# Patient Record
Sex: Male | Born: 1995 | Race: White | Hispanic: No | Marital: Single | Smoking: Former smoker
Health system: Southern US, Community
[De-identification: ages and names within clinical notes are randomized; demographics above are authoritative.]

## PROBLEM LIST (undated history)

## (undated) DIAGNOSIS — J45909 Unspecified asthma, uncomplicated: Secondary | ICD-10-CM

## (undated) HISTORY — PX: OTHER SURGICAL HISTORY: SHX169

---

## 2020-04-03 ENCOUNTER — Encounter: Payer: Self-pay | Admitting: Emergency Medicine

## 2020-04-03 ENCOUNTER — Telehealth: Payer: Self-pay

## 2020-04-03 ENCOUNTER — Other Ambulatory Visit: Payer: Self-pay

## 2020-04-03 ENCOUNTER — Ambulatory Visit
Admission: EM | Admit: 2020-04-03 | Discharge: 2020-04-03 | Disposition: A | Discharge status: Home / Self Care | Payer: BC Managed Care – PPO | Attending: Family Medicine | Admitting: Family Medicine

## 2020-04-03 DIAGNOSIS — R369 Urethral discharge, unspecified: Secondary | ICD-10-CM | POA: Insufficient documentation

## 2020-04-03 HISTORY — DX: Unspecified asthma, uncomplicated: J45.909

## 2020-04-03 MED ORDER — CEFTRIAXONE SODIUM 500 MG IJ SOLR: 500.0000 mg | Freq: Once | INTRAMUSCULAR | Status: DC

## 2020-04-03 MED ORDER — DOXYCYCLINE HYCLATE 100 MG PO CAPS: 100.0000 mg | ORAL_CAPSULE | Freq: Two times a day (BID) | ORAL | 0 refills | Status: DC

## 2020-04-03 NOTE — Discharge Instructions (Signed)
We will call with positive results.  Safe sex.  Take care  Dr. Valary Manahan    

## 2020-04-03 NOTE — ED Triage Notes (Signed)
Patient here for STD screening for chlamydia and gonorrhea. Patient states he has had abdominal pain that started last Wednesday and went away this past Monday. Patient also c/o white penile discharge that started last night.

## 2020-04-04 LAB — CHLAMYDIA/NGC RT PCR (ARMC ONLY)
Chlamydia Tr: NOT DETECTED
N gonorrhoeae: NOT DETECTED

## 2020-04-04 NOTE — ED Provider Notes (Signed)
[facility]    CSN: [id number] Arrival date & time: 04/03/20  1758      History   Chief Complaint Chief Complaint  Patient presents with  . Exposure to STD   HPI  24 year old male presents with penile discharge.  Patient reports that last week he had abdominal pain and was seen at [facility].  He had a negative work-up.  Patient reports that his abdominal pain resolved as of this week.  Last night, patient noted white penile discharge.  He is having unprotected intercourse.  He is concerned that he may have an STD.  Desires testing today.  No other associated symptoms.  No other complaints.  Past Medical History:  Diagnosis Date  . Asthma    Home Medications    Prior to Admission medications   Medication Sig Start Date End Date Taking? Authorizing Provider  doxycycline (VIBRAMYCIN) 100 MG capsule Take 1 capsule (100 mg total) by mouth 2 (two) times daily. 04/03/20   Coral Spikes, DO   Social History Social History   Tobacco Use  . Smoking status: Former Research scientist (life sciences)  . Smokeless tobacco: Former Network engineer  . Vaping Use: Some days  Substance Use Topics  . Alcohol use: Yes  . Drug use: Never     Allergies   Penicillins   Review of Systems Review of Systems  Constitutional: Negative.   Genitourinary: Positive for discharge.   Physical Exam Triage Vital Signs ED Triage Vitals  Enc Vitals Group     BP 04/03/20 1842 (!) 155/94     Pulse Rate 04/03/20 1842 (!) 106     Resp 04/03/20 1842 18     Temp 04/03/20 1842 99 F (37.2 C)     Temp Source 04/03/20 1842 Oral     SpO2 04/03/20 1842 100 %     Weight 04/03/20 1840 240 lb (108.9 kg)     Height 04/03/20 1840 5\' 10"  (1.778 m)     Head Circumference --      Peak Flow --      Pain Score 04/03/20 1840 0     Pain Loc --      Pain Edu? --      Excl. in [place]? --    Updated Vital Signs BP (!) 155/94 (BP Location: Right Arm)   Pulse (!) 106   Temp 99 F (37.2 C) (Oral)   Resp 18   Ht 5\' 10"  (1.778 m)    Wt 108.9 kg   SpO2 100%   BMI 34.44 kg/m   Visual Acuity Right Eye Distance:   Left Eye Distance:   Bilateral Distance:    Right Eye Near:   Left Eye Near:    Bilateral Near:     Physical Exam Constitutional:      General: He is not in acute distress.    Appearance: Normal appearance. He is not ill-appearing.  HENT:     Head: Normocephalic and atraumatic.  Eyes:     General:        Right eye: No discharge.        Left eye: No discharge.     Conjunctiva/sclera: Conjunctivae normal.  Pulmonary:     Effort: Pulmonary effort is normal. No respiratory distress.  Genitourinary:    Penis: Normal.      Testes: Normal.  Neurological:     Mental Status: He is alert.  Psychiatric:        Mood and Affect: Mood normal.  Behavior: Behavior normal.    UC Treatments / Results  Labs (all labs ordered are listed, but only abnormal results are displayed) Labs Reviewed  CHLAMYDIA/NGC RT PCR [facility] ONLY)    EKG   Radiology No results found.  Procedures Procedures (including critical care time)  Medications Ordered in UC Medications - No data to display  Initial Impression / Assessment and Plan / UC Course  I have reviewed the triage vital signs and the nursing notes.  Pertinent labs & imaging results that were available during my care of the patient were reviewed by me and considered in my medical decision making (see chart for details).    24 year old male presents with penile discharge.  Was placed empirically on doxycycline after discussion with the patient.  Test results have returned negative (on 6/10).  Patient will be called and informed to stop the medication.  Advise safe sex.  Supportive care.  Final Clinical Impressions(s) / UC Diagnoses   Final diagnoses:  Penile discharge     Discharge Instructions     We will call with positive results.  Safe sex.  Take care  Dr. Adriana Simas    ED Prescriptions    Medication Sig Dispense Auth.  Provider   doxycycline (VIBRAMYCIN) 100 MG capsule Take 1 capsule (100 mg total) by mouth 2 (two) times daily. 14 capsule Everlene Other G, DO     PDMP not reviewed this encounter.   Everlene Other [place], [place] 04/04/20 [phone]

## 2021-05-28 ENCOUNTER — Other Ambulatory Visit: Payer: Self-pay

## 2021-05-28 ENCOUNTER — Emergency Department
Admission: EM | Admit: 2021-05-28 | Discharge: 2021-05-28 | Disposition: A | Discharge status: Home / Self Care | Payer: BC Managed Care – PPO | Attending: Emergency Medicine | Admitting: Emergency Medicine

## 2021-05-28 ENCOUNTER — Emergency Department: Payer: BC Managed Care – PPO

## 2021-05-28 DIAGNOSIS — S5012XA Contusion of left forearm, initial encounter: Secondary | ICD-10-CM | POA: Diagnosis not present

## 2021-05-28 DIAGNOSIS — Z87891 Personal history of nicotine dependence: Secondary | ICD-10-CM | POA: Insufficient documentation

## 2021-05-28 DIAGNOSIS — J45909 Unspecified asthma, uncomplicated: Secondary | ICD-10-CM | POA: Insufficient documentation

## 2021-05-28 DIAGNOSIS — S59912A Unspecified injury of left forearm, initial encounter: Secondary | ICD-10-CM | POA: Diagnosis present

## 2021-05-28 DIAGNOSIS — S0990XA Unspecified injury of head, initial encounter: Secondary | ICD-10-CM | POA: Diagnosis not present

## 2021-05-28 MED ORDER — KETOROLAC TROMETHAMINE 30 MG/ML IJ SOLN
30.0000 mg | Freq: Once | INTRAMUSCULAR | Status: DC
  Filled 2021-05-28: qty 1

## 2021-05-28 MED ORDER — MELOXICAM 15 MG PO TABS: 15.0000 mg | ORAL_TABLET | Freq: Every day | ORAL | 0 refills | Status: AC

## 2021-05-28 NOTE — Discharge Instructions (Addendum)
Take Meloxicam once daily for pain and inflammation.  
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
## 2022-04-06 IMAGING — CT CT HEAD W/O CM
4 series · 16 of 47 positions shown, 18 images · non-contrast
Comparison: None.

CLINICAL DATA: Head trauma, moderate to severe.

EXAM:
CT HEAD WITHOUT CONTRAST
TECHNIQUE: Contiguous axial images were obtained from the base of the skull
through the vertex without intravenous contrast.

[Series 2: head bone · axial · 0.43mm/px · z∈[-151,-123]mm · 3 of 72 slices shown]
[im 8/72  bone]
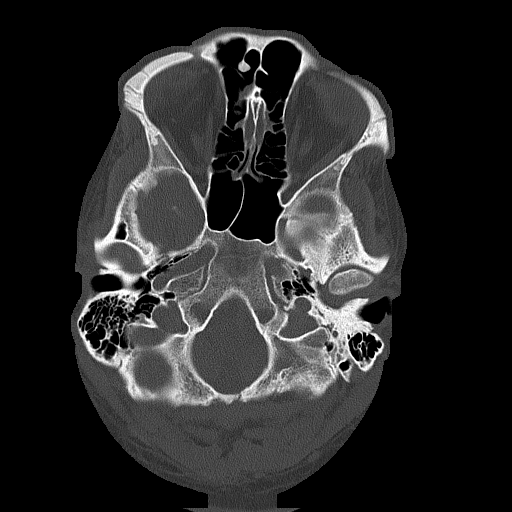
[im 15/72  bone]
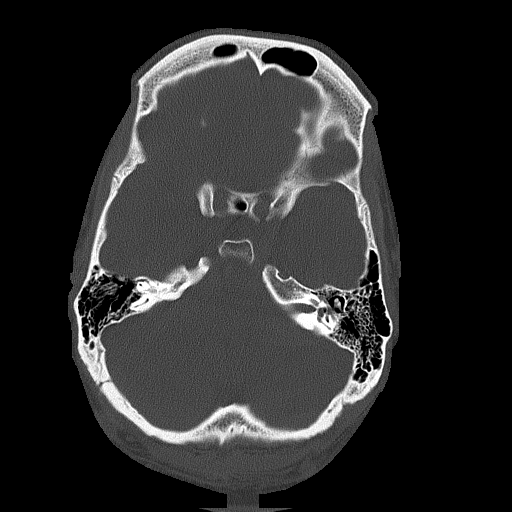
[im 22/72  bone]
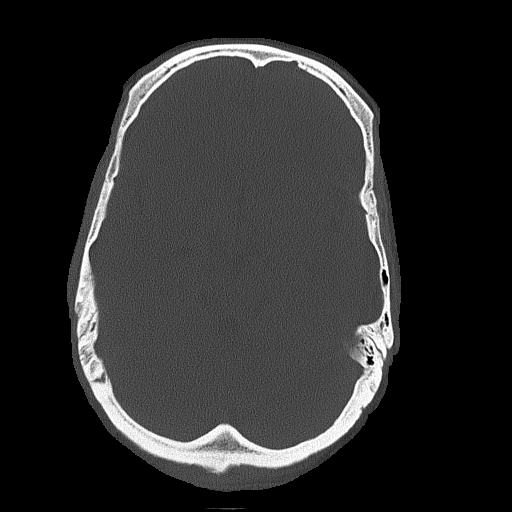

[Series 3: head wo · axial · 0.43mm/px · z∈[-150,-45]mm · 7 of 29 slices shown, 9 images]
[im 4/29  brain]
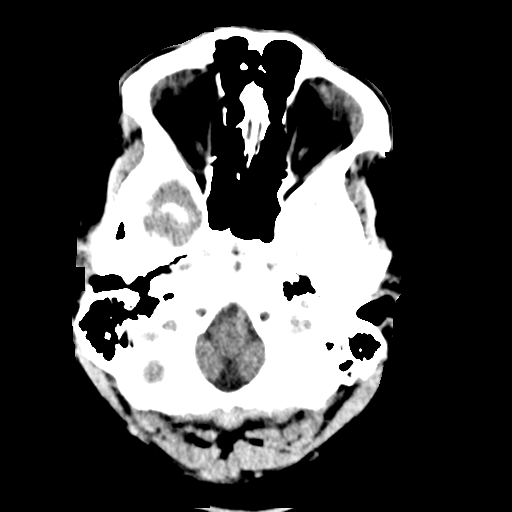
[im 4/29  bone]
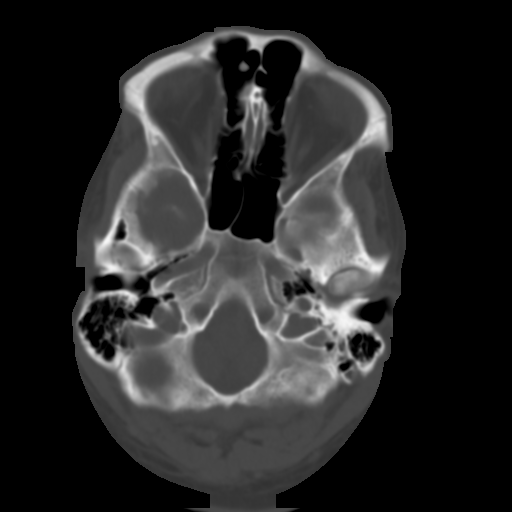
[im 8/29  brain]
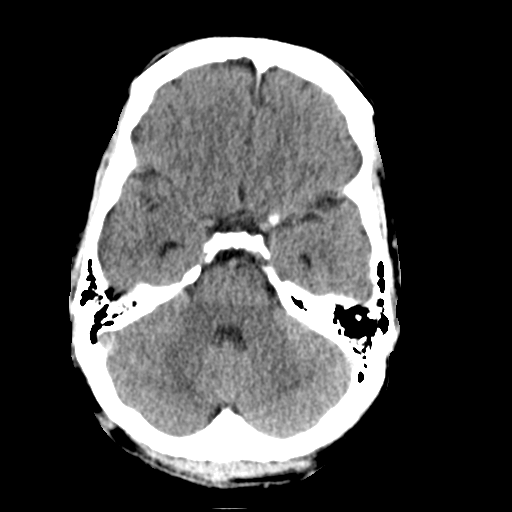
[im 11/29  brain]
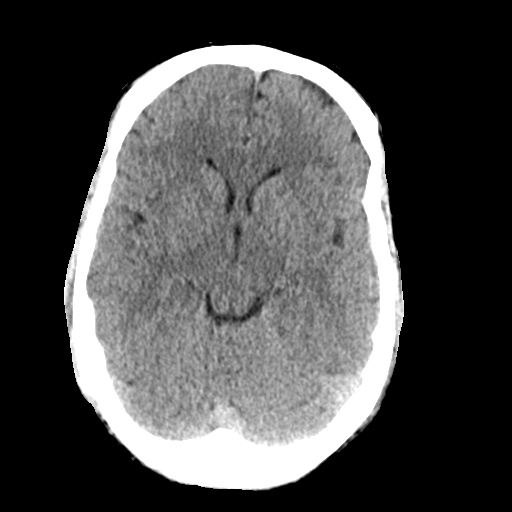
[im 15/29  brain]
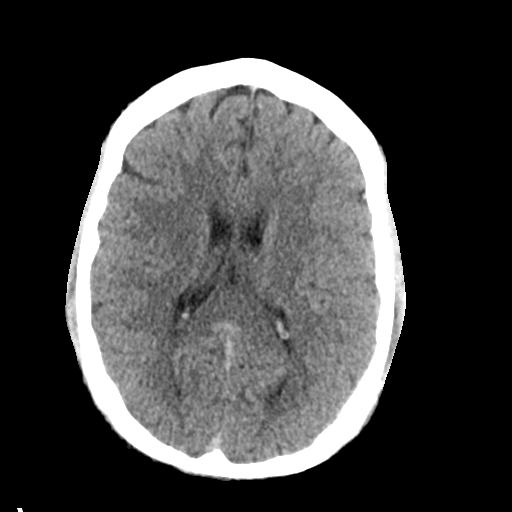
[im 18/29  brain]
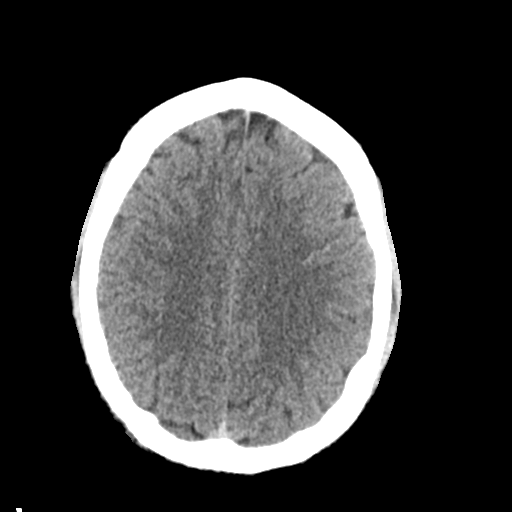
[im 18/29  bone]
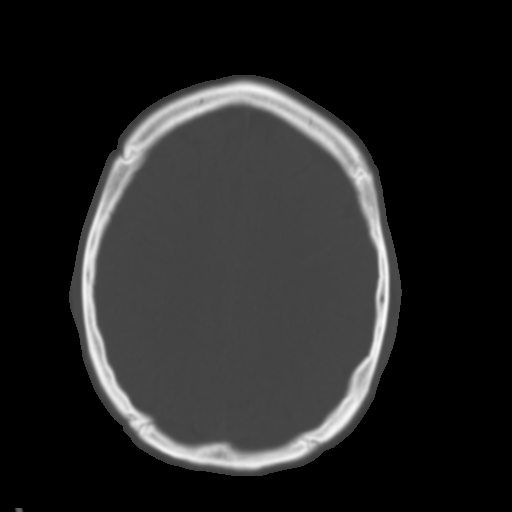
[im 22/29  brain]
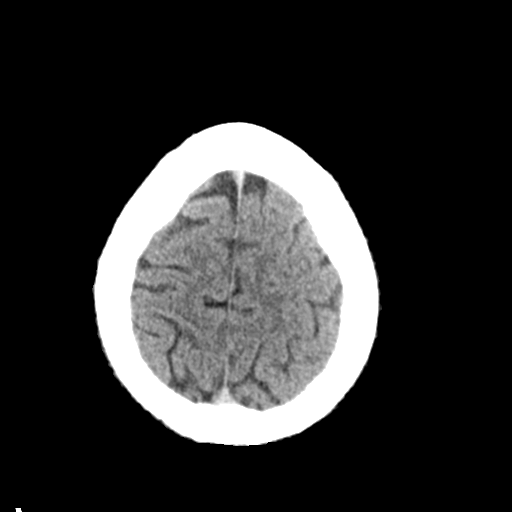
[im 25/29  brain]
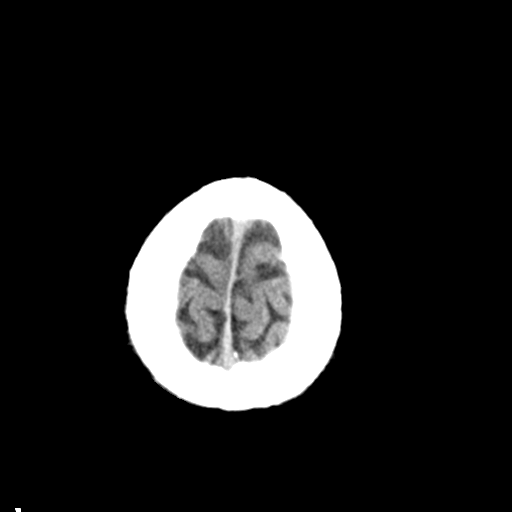

[Series 4: coronal soft tissue · coronal · 0.32mm/px · 3 of 63 slices shown]
[im 21/63  brain]
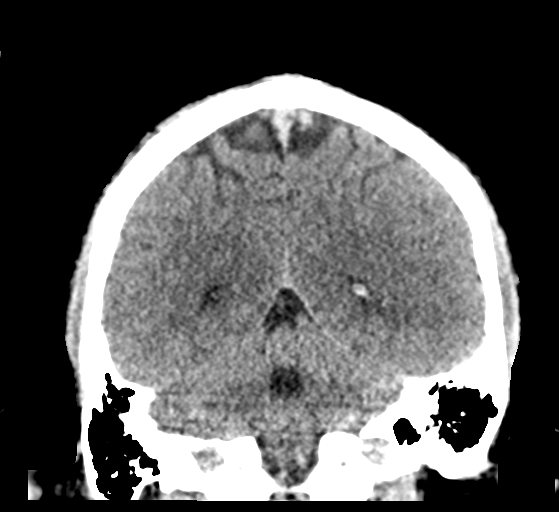
[im 28/63  brain]
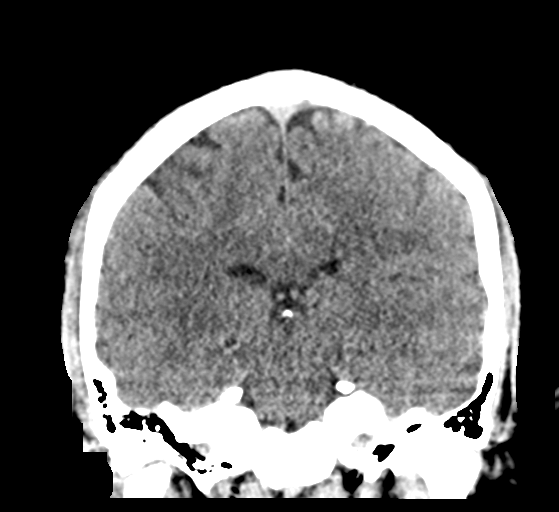
[im 35/63  brain]
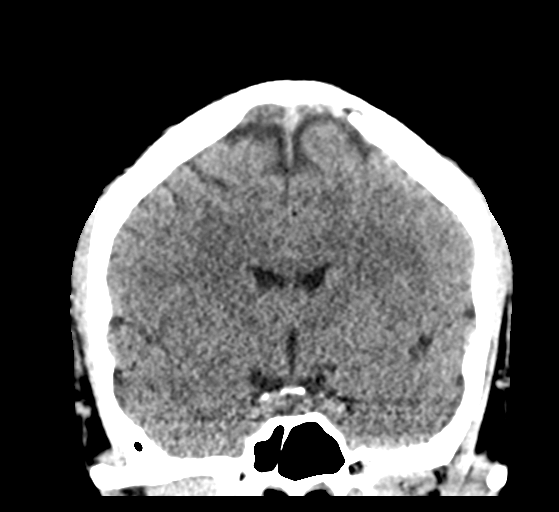

[Series 5: sagittal soft tissue · sagittal · 0.28mm/px · 3 of 54 slices shown]
[im 18/54  brain]
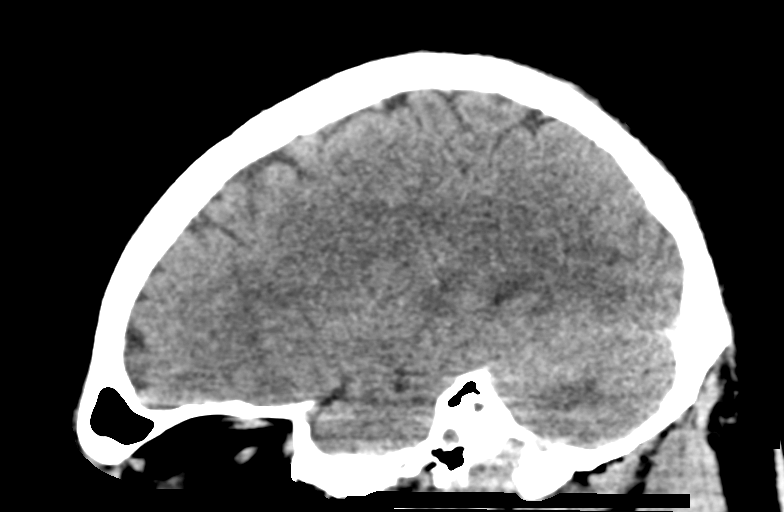
[im 27/54  brain]
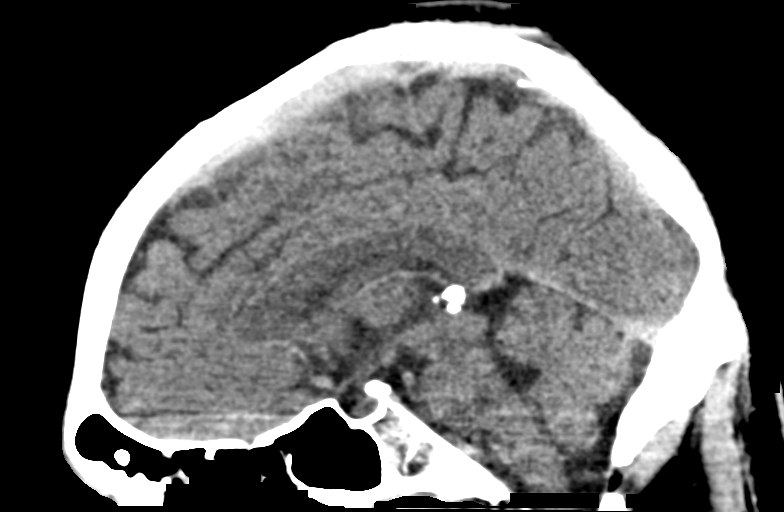
[im 36/54  brain]
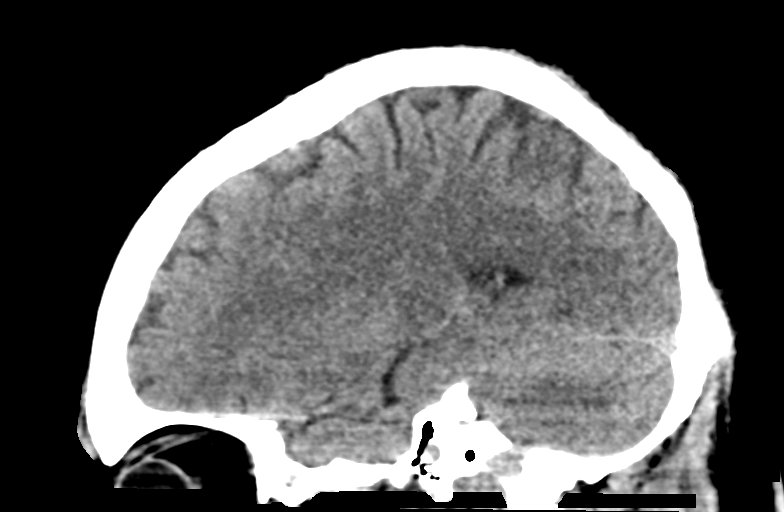

[16 of 47 positions shown; findings below may reference images not displayed]

FINDINGS: Brain: No evidence of acute infarction, hemorrhage, hydrocephalus,
extra-axial collection or mass lesion/mass effect.

Vascular: No hyperdense vessel or unexpected calcification.

Skull: Normal. Negative for fracture or focal lesion.

Sinuses/Orbits: Mild mucosal thickening in the ethmoid air cells.

Other: None
IMPRESSION: No acute intracranial abnormality.

## 2024-05-15 ENCOUNTER — Emergency Department: Payer: MEDICAID

## 2024-05-15 ENCOUNTER — Other Ambulatory Visit: Payer: Self-pay

## 2024-05-15 ENCOUNTER — Inpatient Hospital Stay
Admission: EM | Admit: 2024-05-15 | Discharge: 2024-05-18 | DRG: 872 | Disposition: A | Discharge status: Home / Self Care | Payer: MEDICAID | Attending: Internal Medicine | Admitting: Internal Medicine

## 2024-05-15 DIAGNOSIS — R590 Localized enlarged lymph nodes: Secondary | ICD-10-CM | POA: Diagnosis present

## 2024-05-15 DIAGNOSIS — L03211 Cellulitis of face: Secondary | ICD-10-CM | POA: Diagnosis present

## 2024-05-15 DIAGNOSIS — J45909 Unspecified asthma, uncomplicated: Secondary | ICD-10-CM | POA: Diagnosis present

## 2024-05-15 DIAGNOSIS — L03213 Periorbital cellulitis: Secondary | ICD-10-CM | POA: Diagnosis present

## 2024-05-15 DIAGNOSIS — Z825 Family history of asthma and other chronic lower respiratory diseases: Secondary | ICD-10-CM | POA: Diagnosis not present

## 2024-05-15 DIAGNOSIS — Z8249 Family history of ischemic heart disease and other diseases of the circulatory system: Secondary | ICD-10-CM

## 2024-05-15 DIAGNOSIS — K047 Periapical abscess without sinus: Secondary | ICD-10-CM | POA: Diagnosis present

## 2024-05-15 DIAGNOSIS — Z880 Allergy status to penicillin: Secondary | ICD-10-CM | POA: Diagnosis not present

## 2024-05-15 DIAGNOSIS — E663 Overweight: Secondary | ICD-10-CM | POA: Diagnosis present

## 2024-05-15 DIAGNOSIS — J4520 Mild intermittent asthma, uncomplicated: Secondary | ICD-10-CM

## 2024-05-15 DIAGNOSIS — Z6825 Body mass index (BMI) 25.0-25.9, adult: Secondary | ICD-10-CM

## 2024-05-15 DIAGNOSIS — Z8261 Family history of arthritis: Secondary | ICD-10-CM

## 2024-05-15 DIAGNOSIS — S025XXA Fracture of tooth (traumatic), initial encounter for closed fracture: Secondary | ICD-10-CM | POA: Diagnosis present

## 2024-05-15 DIAGNOSIS — Z87891 Personal history of nicotine dependence: Secondary | ICD-10-CM | POA: Diagnosis not present

## 2024-05-15 DIAGNOSIS — A419 Sepsis, unspecified organism: Principal | ICD-10-CM | POA: Diagnosis present

## 2024-05-15 LAB — CBC WITH DIFFERENTIAL/PLATELET
Abs Immature Granulocytes: 0.09 K/uL — ABNORMAL HIGH (ref 0.00–0.07)
Basophils Absolute: 0 K/uL (ref 0.0–0.1)
Basophils Relative: 0 %
Eosinophils Absolute: 0.1 K/uL (ref 0.0–0.5)
Eosinophils Relative: 1 %
HCT: 42.8 % (ref 39.0–52.0)
Hemoglobin: 14.3 g/dL (ref 13.0–17.0)
Immature Granulocytes: 1 %
Lymphocytes Relative: 11 %
Lymphs Abs: 1.7 K/uL (ref 0.7–4.0)
MCH: 29.5 pg (ref 26.0–34.0)
MCHC: 33.4 g/dL (ref 30.0–36.0)
MCV: 88.2 fL (ref 80.0–100.0)
Monocytes Absolute: 1.8 K/uL — ABNORMAL HIGH (ref 0.1–1.0)
Monocytes Relative: 11 %
Neutro Abs: 12.2 K/uL — ABNORMAL HIGH (ref 1.7–7.7)
Neutrophils Relative %: 76 %
Platelets: 268 K/uL (ref 150–400)
RBC: 4.85 MIL/uL (ref 4.22–5.81)
RDW: 13.2 % (ref 11.5–15.5)
WBC: 15.8 K/uL — ABNORMAL HIGH (ref 4.0–10.5)
nRBC: 0 % (ref 0.0–0.2)

## 2024-05-15 LAB — COMPREHENSIVE METABOLIC PANEL WITH GFR
ALT: 13 U/L (ref 0–44)
AST: 20 U/L (ref 15–41)
Albumin: 4.3 g/dL (ref 3.5–5.0)
Alkaline Phosphatase: 71 U/L (ref 38–126)
Anion gap: 10 (ref 5–15)
BUN: 12 mg/dL (ref 6–20)
CO2: 28 mmol/L (ref 22–32)
Calcium: 9.3 mg/dL (ref 8.9–10.3)
Chloride: 100 mmol/L (ref 98–111)
Creatinine, Ser: 0.72 mg/dL (ref 0.61–1.24)
GFR, Estimated: >60 mL/min (ref >60)
Glucose, Bld: 108 mg/dL — ABNORMAL HIGH (ref 70–99)
Potassium: 3.7 mmol/L (ref 3.5–5.1)
Sodium: 138 mmol/L (ref 135–145)
Total Bilirubin: 1 mg/dL (ref 0.0–1.2)
Total Protein: 7.6 g/dL (ref 6.5–8.1)

## 2024-05-15 LAB — LACTIC ACID, PLASMA
Lactic Acid, Venous: 1 mmol/L (ref 0.5–1.9)
Lactic Acid, Venous: 0.7 mmol/L (ref 0.5–1.9)

## 2024-05-15 LAB — SEDIMENTATION RATE: Sed Rate: 14 mm/h (ref 0–15)

## 2024-05-15 MED ORDER — SODIUM CHLORIDE 0.9 % IV BOLUS
1000.0000 mL | Freq: Once | INTRAVENOUS | Status: AC
  Administered 2024-05-15: 1000 mL via INTRAVENOUS

## 2024-05-15 MED ORDER — IBUPROFEN 800 MG PO TABS
800.0000 mg | ORAL_TABLET | Freq: Once | ORAL | Status: AC
  Administered 2024-05-15: 800 mg via ORAL
  Filled 2024-05-15: qty 1

## 2024-05-15 MED ORDER — IOHEXOL 300 MG/ML  SOLN
75.0000 mL | Freq: Once | INTRAMUSCULAR | Status: AC | PRN
Start: 2024-05-15 — End: 2024-05-15
  Administered 2024-05-15: 75 mL via INTRAVENOUS

## 2024-05-15 MED ORDER — LACTATED RINGERS IV SOLN: INTRAVENOUS | Status: AC

## 2024-05-15 MED ORDER — MORPHINE SULFATE (PF) 4 MG/ML IV SOLN
4.0000 mg | Freq: Once | INTRAVENOUS | Status: AC
  Administered 2024-05-15: 4 mg via INTRAVENOUS
  Filled 2024-05-15: qty 1

## 2024-05-15 MED ORDER — ALBUTEROL SULFATE HFA 108 (90 BASE) MCG/ACT IN AERS: 2.0000 | INHALATION_SPRAY | RESPIRATORY_TRACT | Status: DC | PRN

## 2024-05-15 MED ORDER — HEPARIN SODIUM (PORCINE) 5000 UNIT/ML IJ SOLN
5000.0000 [IU] | Freq: Three times a day (TID) | INTRAMUSCULAR | Status: DC
  Administered 2024-05-16 (×2): 5000 [IU] via SUBCUTANEOUS
  Filled 2024-05-15 (×2): qty 1

## 2024-05-15 MED ORDER — ALBUTEROL SULFATE (2.5 MG/3ML) 0.083% IN NEBU: 2.5000 mg | INHALATION_SOLUTION | RESPIRATORY_TRACT | Status: DC | PRN

## 2024-05-15 MED ORDER — LACTATED RINGERS IV SOLN: INTRAVENOUS | Status: DC

## 2024-05-15 MED ORDER — METRONIDAZOLE 500 MG/100ML IV SOLN
500.0000 mg | Freq: Once | INTRAVENOUS | Status: AC
  Administered 2024-05-15: 500 mg via INTRAVENOUS
  Filled 2024-05-15: qty 100

## 2024-05-15 MED ORDER — ONDANSETRON HCL 4 MG/2ML IJ SOLN: 4.0000 mg | Freq: Three times a day (TID) | INTRAMUSCULAR | Status: DC | PRN

## 2024-05-15 MED ORDER — ACETAMINOPHEN 325 MG PO TABS: 650.0000 mg | ORAL_TABLET | Freq: Four times a day (QID) | ORAL | Status: DC | PRN

## 2024-05-15 MED ORDER — DEXAMETHASONE SODIUM PHOSPHATE 10 MG/ML IJ SOLN
8.0000 mg | Freq: Two times a day (BID) | INTRAMUSCULAR | Status: DC
  Administered 2024-05-16 – 2024-05-17 (×3): 8 mg via INTRAVENOUS
  Filled 2024-05-15 (×3): qty 1

## 2024-05-15 MED ORDER — HYDROMORPHONE HCL 1 MG/ML IJ SOLN
1.0000 mg | Freq: Once | INTRAMUSCULAR | Status: AC
  Administered 2024-05-15: 1 mg via INTRAVENOUS
  Filled 2024-05-15: qty 1

## 2024-05-15 MED ORDER — LACTATED RINGERS IV BOLUS
500.0000 mL | Freq: Once | INTRAVENOUS | Status: AC
  Administered 2024-05-15: 500 mL via INTRAVENOUS

## 2024-05-15 MED ORDER — DEXAMETHASONE SODIUM PHOSPHATE 10 MG/ML IJ SOLN
10.0000 mg | Freq: Once | INTRAMUSCULAR | Status: AC
  Administered 2024-05-15: 10 mg via INTRAVENOUS
  Filled 2024-05-15: qty 1

## 2024-05-15 MED ORDER — ONDANSETRON HCL 4 MG/2ML IJ SOLN
4.0000 mg | Freq: Once | INTRAMUSCULAR | Status: AC
  Administered 2024-05-15: 4 mg via INTRAVENOUS
  Filled 2024-05-15: qty 2

## 2024-05-15 MED ORDER — SODIUM CHLORIDE 0.9 % IV SOLN
2.0000 g | Freq: Once | INTRAVENOUS | Status: AC
  Administered 2024-05-15: 2 g via INTRAVENOUS
  Filled 2024-05-15: qty 10

## 2024-05-15 MED ORDER — MORPHINE SULFATE (PF) 2 MG/ML IV SOLN
2.0000 mg | INTRAVENOUS | Status: DC | PRN
  Administered 2024-05-15 – 2024-05-16 (×2): 2 mg via INTRAVENOUS
  Filled 2024-05-15 (×2): qty 1

## 2024-05-15 MED ORDER — LACTATED RINGERS IV BOLUS (SEPSIS)
1000.0000 mL | Freq: Once | INTRAVENOUS | Status: AC
  Administered 2024-05-15: 1000 mL via INTRAVENOUS

## 2024-05-15 MED ORDER — OXYCODONE-ACETAMINOPHEN 5-325 MG PO TABS: 1.0000 | ORAL_TABLET | ORAL | Status: DC | PRN

## 2024-05-15 MED ORDER — OXYCODONE-ACETAMINOPHEN 5-325 MG PO TABS
1.0000 | ORAL_TABLET | ORAL | Status: AC | PRN
  Administered 2024-05-15 (×2): 1 via ORAL
  Filled 2024-05-15 (×2): qty 1

## 2024-05-15 MED ORDER — VANCOMYCIN HCL IN DEXTROSE 1-5 GM/200ML-% IV SOLN
1000.0000 mg | Freq: Once | INTRAVENOUS | Status: AC
  Administered 2024-05-15: 1000 mg via INTRAVENOUS
  Filled 2024-05-15: qty 200

## 2024-05-15 MED ORDER — CLINDAMYCIN PHOSPHATE 600 MG/50ML IV SOLN
600.0000 mg | Freq: Three times a day (TID) | INTRAVENOUS | Status: DC
  Administered 2024-05-16 (×2): 600 mg via INTRAVENOUS
  Filled 2024-05-15 (×3): qty 50

## 2024-05-15 MED ORDER — DM-GUAIFENESIN ER 30-600 MG PO TB12: 1.0000 | ORAL_TABLET | Freq: Two times a day (BID) | ORAL | Status: DC | PRN

## 2024-05-15 NOTE — Sepsis Progress Note (Signed)
 Elink following code sepsis

## 2024-05-15 NOTE — H&P (Signed)
 History and Physical    Luke Walter FMW:[id number] DOB: [birth year] DOA: 05/15/2024  Referring MD/NP/PA:   PCP: Patient, No Pcp Per   Patient coming from:  The patient is coming from home.     Chief Complaint: Dental pain, left sided facial swelling  HPI: Luke Walter is a 28 y.o. male with medical history significant of asthma and overweight, who presents with dental pain in the left side facial swelling.  Per patient and his mother at the bedside, he has left upper dental pain due to broken tooth on that side for more than 4 days, which has significantly worsened since last night.  He developed left facial swelling, fever and chills.  His temperature is 102.5 in ED.  His dental pain is severe, constant, aching, nonradiating.  Patient has trismus, with difficulty opening mouth.  No chest pain, cough, SOB.  No nausea, vomiting, diarrhea or abdominal.  No symptoms UTI.  Mother called the dentist, but the earliest available appointment will be on Thursday.  Data reviewed independently and ED Course: pt was found to have WBC 15.8, GFR> 60, lactic acid 1.0 --> 0.7.  Temperature while 2.5, blood pressure 127/83, heart rate 114, RR 20, oxygen saturation 97% on room air.  Patient is admitted to telemetry bed as inpatient.  ED physician consulted Dr. Rumalda of ENT.  CT-Neck soft  tissue: -1.7 cm abscess in the soft tissues along the left anterior aspect of the maxilla adjacent to a periapical abscess involving the left maxillary lateral incisor.   Surrounding soft tissue swelling within the facial soft tissues as above with extension into the preseptal soft tissues of the left orbit. Findings concerning for facial/preseptal cellulitis. There are no findings to suggest orbital cellulitis.   Enlarged cervical lymph nodes, likely reactive.    EKG: Not done in ED, will get one.    Review of Systems:   General: has fevers, chills, no body weight gain. HEENT: no blurry vision, hearing changes  or sore throat. Has dental pain and left facial swelling. Respiratory: no dyspnea, coughing, wheezing CV: no chest pain, no palpitations GI: no nausea, vomiting, abdominal pain, diarrhea, constipation GU: no dysuria, burning on urination, increased urinary frequency, hematuria  Ext: no leg edema Neuro: no unilateral weakness, numbness, or tingling, no vision change or hearing loss Skin: no rash, no skin tear. MSK: No muscle spasm, no deformity, no limitation of range of movement in spin Heme: No easy bruising.  Travel history: No recent long distant travel.   Allergy:  Allergies  Allergen Reactions   Penicillins Hives, Other (See Comments) and Swelling    Throat swelling and hives   Amoxicillin Hives and Swelling    Past Medical History:  Diagnosis Date   Asthma     Past Surgical History:  Procedure Laterality Date   dental procedure      Social History:  reports that he has quit smoking. He has quit using smokeless tobacco. He reports current alcohol use. He reports that he does not use drugs.  Family History:  Family History  Problem Relation Age of Onset   Heart failure Mother    COPD Mother    Rheum arthritis Mother      Prior to Admission medications   Medication Sig Start Date End Date Taking? Authorizing Provider  doxycycline  (VIBRAMYCIN ) 100 MG capsule Take 1 capsule (100 mg total) by mouth 2 (two) times daily. 04/03/20   Cook, Jayce G, DO    Physical Exam: Vitals:   05/15/24  1910 05/15/24 2000 05/15/24 2030 05/15/24 2100  BP:  122/75 127/85 127/83  Pulse: 90 99 96 (!) 114  Resp: 18   18  Temp: 99.5 F (37.5 C)     TempSrc: Oral   Oral  SpO2: 98% 98% 99% 97%  Weight:      Height:       General: Not in acute distress HEENT: has facial swelling, worse on the left, has chronic appearing dental fracture tooth #10 without obvious area of fluctuance. Has trismus.        Eyes: PERRL, EOMI, no jaundice       ENT: No discharge from the ears and nose        Neck: No JVD, no bruit, no mass felt. Heme: No neck lymph node enlargement. Cardiac: S1/S2, RRR, No murmurs, No gallops or rubs. Respiratory: No rales, wheezing, rhonchi or rubs. GI: Soft, nondistended, nontender, no rebound pain, no organomegaly, BS present. GU: No hematuria Ext: No pitting leg edema bilaterally. 1+DP/PT pulse bilaterally. Musculoskeletal: No joint deformities, No joint redness or warmth, no limitation of ROM in spin. Skin: No rashes.  Neuro: Alert, oriented X3, cranial nerves II-XII grossly intact, moves all extremities normally. Psych: Patient is not psychotic, no suicidal or hemocidal ideation.  Labs on Admission: I have personally reviewed following labs and imaging studies  CBC: Recent Labs  Lab 05/15/24 1555  WBC 15.8*  NEUTROABS 12.2*  HGB 14.3  HCT 42.8  MCV 88.2  PLT 268   Basic Metabolic Panel: Recent Labs  Lab 05/15/24 1555  NA 138  K 3.7  CL 100  CO2 28  GLUCOSE 108*  BUN 12  CREATININE 0.72  CALCIUM 9.3   GFR: Estimated Creatinine Clearance: 143.2 mL/min (by C-G formula based on SCr of 0.72 mg/dL). Liver Function Tests: Recent Labs  Lab 05/15/24 1555  AST 20  ALT 13  ALKPHOS 71  BILITOT 1.0  PROT 7.6  ALBUMIN 4.3   No results for input(s): LIPASE, AMYLASE in the last 168 hours. No results for input(s): AMMONIA in the last 168 hours. Coagulation Profile: No results for input(s): INR, PROTIME in the last 168 hours. Cardiac Enzymes: No results for input(s): CKTOTAL, CKMB, CKMBINDEX, TROPONINI in the last 168 hours. BNP (last 3 results) No results for input(s): PROBNP in the last 8760 hours. HbA1C: No results for input(s): HGBA1C in the last 72 hours. CBG: No results for input(s): GLUCAP in the last 168 hours. Lipid Profile: No results for input(s): CHOL, HDL, LDLCALC, TRIG, CHOLHDL, LDLDIRECT in the last 72 hours. Thyroid Function Tests: No results for input(s): TSH, T4TOTAL,  FREET4, T3FREE, THYROIDAB in the last 72 hours. Anemia Panel: No results for input(s): VITAMINB12, FOLATE, FERRITIN, TIBC, IRON, RETICCTPCT in the last 72 hours. Urine analysis: No results found for: COLORURINE, APPEARANCEUR, LABSPEC, PHURINE, GLUCOSEU, HGBUR, BILIRUBINUR, KETONESUR, PROTEINUR, UROBILINOGEN, NITRITE, LEUKOCYTESUR Sepsis Labs: @LABRCNTIP (procalcitonin:4,lacticidven:4) )No results found for this or any previous visit (from the past 240 hours).   Radiological Exams on Admission:   Assessment/Plan Principal Problem:   Periapical abscess Active Problems:   Sepsis (HCC)   Facial cellulitis   Preseptal cellulitis   Asthma   Overweight (BMI 25.0-29.9)   Assessment and Plan:  Sepsis due to periapical abscess,  preseptal cellulitis and  facial cellulitis: pt meets criteria for sepsis with WBC 15.8, temperature 102.5, heart rate 114.  Lactic acid normal 1.0 --> 0.7.  Currently hemodynamically stable.  EDP consulted Dr. Rumalda of ENT, who recommended to transfer patient to  where they have dental medicine or OMFS. Dr. Jossie of ED has made tremendous effort trying to transfer patient to the facility where there is dental medicine or OMFS.  Dr. Adine called [place], [facility] and [facility], unfortunately [facility] has no bed available.  I discussed with the patient, he and his mother understand that we do not have dental medicine or oral surgeon to deal with the offending tooth, they agree to stay here for antibiotic treatment.  Patient's mother will continue to contact his dentist, hoping to move his appointment from Thursday to earlier time.  - will admit to med-surg bed as inpatient - Empiric antimicrobial treatment with clindamycin  (patient received 1 dose of aztreonam , Flagyl  and vancomycin  in ED) - Decadron  8 mg twice daily - PRN Zofran  for nausea, and Tylenol , morphine  and Percocet for pain - Blood cultures x 2  - ESR and CRP - IVF: 1.5 L of  RL and 1L of NS bolus, followed by 75 cc/h    Asthma: stable - As needed albuterol  and Mucinex   Overweight (BMI 25.0-29.9): Body weight 81.6 kg, BMI 25.83 - Encourage losing weight - Exercise and healthy diet      DVT ppx: SQ Heparin     Code Status: Full code   Family Communication: mother at bedside  Disposition Plan:  Anticipate discharge back to previous environment  Consults called:  EDP consulted Dr. Rumalda of ENT  Admission status and Level of care: Telemetry Medical: as inpt        Dispo: The patient is from: Home              Anticipated d/c is to: Home              Anticipated d/c date is: 2 days              Patient currently is not medically stable to d/c.    Severity of Illness:  The appropriate patient status for this patient is INPATIENT. Inpatient status is judged to be reasonable and necessary in order to provide the required intensity of service to ensure the patient's safety. The patient's presenting symptoms, physical exam findings, and initial radiographic and laboratory data in the context of their chronic comorbidities is felt to place them at high risk for further clinical deterioration. Furthermore, it is not anticipated that the patient will be medically stable for discharge from the hospital within 2 midnights of admission.   * I certify that at the point of admission it is my clinical judgment that the patient will require inpatient hospital care spanning beyond 2 midnights from the point of admission due to high intensity of service, high risk for further deterioration and high frequency of surveillance required.*       Date of Service 05/16/2024    Caleb Exon [facility]   If 7PM-7AM, please contact night-coverage [email or web address] 05/16/2024, 1:34 AM

## 2024-05-15 NOTE — ED Triage Notes (Addendum)
 Pt presents with dental pain and swelling to the L side of his face that started 3 days ago. Pt states he has a broken tooth on that side. He also reports bad taste in his mouth and some difficulty swallowing. Denies fevers, chills. Pt has been taken ibuprofen  and APAP. Last dose was at 01:30 today. Pt noted to be without drooling and is swallowing secretions without difficulty; no difficulty breathing noted. Trismus present.

## 2024-05-15 NOTE — ED Provider Notes (Signed)
 [facility] Provider Note    Event Date/Time   First MD Initiated Contact with Patient 05/15/24 1614     (approximate)   History   Dental Pain   HPI  Luke Walter is a 28 y.o. male with history of asthma and as listed in EMR presents to the emergency department for treatment and evaluation of dental pain and swelling that initially only involve the left side of his face that started about 3 days ago.  Broken tooth but had not had any pain until 3 days ago.  He has had a bad taste in his mouth and swallowing feels different.  He took ibuprofen  and Tylenol  around 1:30 AM but otherwise has not had any medications.  He is able to control secretions and speech is clear.  He has significant pain when trying to open his mouth.  Swelling has now moved from the left side of the face across the nose and into the right side.   Physical Exam    Vitals:   05/15/24 1815 05/15/24 1817  BP:  122/68  Pulse: 86 86  Resp: 13 16  Temp:  (!) 101.1 F (38.4 C)  SpO2: 98% 98%    General: Awake, no distress.  CV:  Good peripheral perfusion.  Resp:  Normal effort.  Abd:  No distention.  Other:  Diffuse facial swelling, worse on the left.  Chronic appearing dental fracture tooth #10 without obvious area of fluctuance.   ED Results / Procedures / Treatments   Labs (all labs ordered are listed, but only abnormal results are displayed)  Labs Reviewed  COMPREHENSIVE METABOLIC PANEL WITH GFR - Abnormal; Notable for the following components:      Result Value   Glucose, Bld 108 (*)    All other components within normal limits  CBC WITH DIFFERENTIAL/PLATELET - Abnormal; Notable for the following components:   WBC 15.8 (*)    Neutro Abs 12.2 (*)    Monocytes Absolute 1.8 (*)    Abs Immature Granulocytes 0.09 (*)    All other components within normal limits  CULTURE, BLOOD (ROUTINE X 2)  CULTURE, BLOOD (ROUTINE X 2)  LACTIC ACID, PLASMA  LACTIC ACID, PLASMA      EKG  Not indicated.   RADIOLOGY  Image and radiology report reviewed and interpreted by me. Radiology report consistent with the same.  Pending.  PROCEDURES:  Critical Care performed: Yes, see critical care procedure note(s)  Procedures   MEDICATIONS ORDERED IN ED:  Medications  oxyCODONE -acetaminophen  (PERCOCET/ROXICET) 5-325 MG per tablet 1 tablet (1 tablet Oral Given 05/15/24 1153)  lactated ringers  infusion ( Intravenous New Bag/Given 05/15/24 1758)  sodium chloride  0.9 % bolus 1,000 mL (has no administration in time range)  lactated ringers  bolus 1,000 mL (0 mLs Intravenous Stopped 05/15/24 1751)  aztreonam  (AZACTAM ) 2 g in sodium chloride  0.9 % 100 mL IVPB (2 g Intravenous New Bag/Given 05/15/24 1802)  metroNIDAZOLE  (FLAGYL ) IVPB 500 mg (0 mg Intravenous Stopped 05/15/24 1751)  vancomycin  (VANCOCIN ) IVPB 1000 mg/200 mL premix (0 mg Intravenous Stopped 05/15/24 1817)  dexamethasone  (DECADRON ) injection 10 mg (10 mg Intravenous Given 05/15/24 1646)  ibuprofen  (ADVIL ) tablet 800 mg (800 mg Oral Given 05/15/24 1644)  iohexol  (OMNIPAQUE ) 300 MG/ML solution 75 mL (75 mLs Intravenous Contrast Given 05/15/24 1716)  morphine  (PF) 4 MG/ML injection 4 mg (4 mg Intravenous Given 05/15/24 1755)  ondansetron  (ZOFRAN ) injection 4 mg (4 mg Intravenous Given 05/15/24 1754)     IMPRESSION / MDM /  ASSESSMENT AND PLAN / ED COURSE   I have reviewed the triage note and vital signs. Vital signs concerning. Temperature of 102.5, heart rate of 91, blood pressure is stable.  Differential diagnosis includes, but is not limited to, facial cellulitis, dental abscess, sepsis  Patient's presentation is most consistent with acute presentation with potential threat to life or bodily function.  The patient is on the cardiac monitor to evaluate for evidence of arrhythmia and/or significant heart rate changes.  28 year old male presenting to the emergency department for treatment and evaluation of  facial swelling.  See HPI for further details.  While awaiting ER room assignment, labs were obtained.  He has a leukocytosis of 15.8 with left shift.  CMP is reassuring.  Patient does meet sepsis criteria.  IV fluids and broad-spectrum antibiotics ordered under sepsis protocol.  Clinical Course as of 05/15/24 1856  Mon May 15, 2024  1800 Initial lactic acid is 1.  CT has been performed and is pending. [CT]  1853 CT report is pending.  Care transferred to Dr. Jossie who will follow up on CT and admit. [CT]    Clinical Course User Index [CT] Darnesha Diloreto B, FNP   CRITICAL CARE Performed by: Kirk Gentry   Total critical care time: 45 minutes  Critical care time was exclusive of separately billable procedures and treating other patients.  Critical care was necessary to treat or prevent imminent or life-threatening deterioration.  Critical care was time spent personally by me on the following activities: development of treatment plan with patient and/or surrogate as well as nursing, discussions with consultants, evaluation of patient's response to treatment, examination of patient, obtaining history from patient or surrogate, ordering and performing treatments and interventions, ordering and review of laboratory studies, ordering and review of radiographic studies, pulse oximetry and re-evaluation of patient's condition.   FINAL CLINICAL IMPRESSION(S) / ED DIAGNOSES   Final diagnoses:  Facial cellulitis  Sepsis without acute organ dysfunction, due to unspecified organism [facility])     Rx / DC Orders   ED Discharge Orders     None        Note:  This document was prepared using Dragon voice recognition software and may include unintentional dictation errors.   Gentry Kirk NOVAK, FNP 05/15/24 1856    Bradler, Evan K, MD 05/15/24 [phone]

## 2024-05-15 NOTE — Consult Note (Signed)
 CODE SEPSIS - PHARMACY COMMUNICATION  **Broad Spectrum Antibiotics should be administered within 1 hour of Sepsis diagnosis**  Time Code Sepsis Called/Page Received: 1624  Antibiotics Ordered: Aztreonam , Vancomycin , Metronidazole    Time of 1st antibiotic administration: 1652  Additional action taken by pharmacy: N/A  If necessary, Name of Provider/Nurse Contacted: N/A  Evonnie Nieves ,PharmD Clinical Pharmacist  05/15/2024  4:34 PM

## 2024-05-16 ENCOUNTER — Encounter: Payer: Self-pay | Admitting: Internal Medicine

## 2024-05-16 ENCOUNTER — Other Ambulatory Visit: Payer: Self-pay

## 2024-05-16 DIAGNOSIS — K047 Periapical abscess without sinus: Secondary | ICD-10-CM | POA: Diagnosis not present

## 2024-05-16 LAB — BASIC METABOLIC PANEL WITH GFR
Anion gap: 9 (ref 5–15)
BUN: 11 mg/dL (ref 6–20)
CO2: 28 mmol/L (ref 22–32)
Calcium: 9.2 mg/dL (ref 8.9–10.3)
Chloride: 103 mmol/L (ref 98–111)
Creatinine, Ser: 0.61 mg/dL (ref 0.61–1.24)
GFR, Estimated: >60 mL/min (ref >60)
Glucose, Bld: 151 mg/dL — ABNORMAL HIGH (ref 70–99)
Potassium: 4.3 mmol/L (ref 3.5–5.1)
Sodium: 140 mmol/L (ref 135–145)

## 2024-05-16 LAB — CBC
HCT: 38.5 % — ABNORMAL LOW (ref 39.0–52.0)
Hemoglobin: 13.1 g/dL (ref 13.0–17.0)
MCH: 30 pg (ref 26.0–34.0)
MCHC: 34 g/dL (ref 30.0–36.0)
MCV: 88.1 fL (ref 80.0–100.0)
Platelets: 249 K/uL (ref 150–400)
RBC: 4.37 MIL/uL (ref 4.22–5.81)
RDW: 12.7 % (ref 11.5–15.5)
WBC: 13 K/uL — ABNORMAL HIGH (ref 4.0–10.5)
nRBC: 0 % (ref 0.0–0.2)

## 2024-05-16 LAB — PROTIME-INR
INR: 1.1 (ref 0.8–1.2)
Prothrombin Time: 15.1 s (ref 11.4–15.2)

## 2024-05-16 LAB — C-REACTIVE PROTEIN: CRP: 11.3 mg/dL — ABNORMAL HIGH (ref <1.0)

## 2024-05-16 LAB — HIV ANTIBODY (ROUTINE TESTING W REFLEX): HIV Screen 4th Generation wRfx: NONREACTIVE

## 2024-05-16 LAB — APTT: aPTT: 38 s — ABNORMAL HIGH (ref 24–36)

## 2024-05-16 MED ORDER — METRONIDAZOLE 500 MG PO TABS
500.0000 mg | ORAL_TABLET | Freq: Two times a day (BID) | ORAL | Status: DC
  Administered 2024-05-16 – 2024-05-18 (×5): 500 mg via ORAL
  Filled 2024-05-16 (×5): qty 1

## 2024-05-16 MED ORDER — SODIUM CHLORIDE 0.9 % IV SOLN
2.0000 g | INTRAVENOUS | Status: DC
  Administered 2024-05-16 – 2024-05-17 (×2): 2 g via INTRAVENOUS
  Filled 2024-05-16 (×3): qty 20

## 2024-05-16 MED ORDER — MORPHINE SULFATE (PF) 2 MG/ML IV SOLN
2.0000 mg | INTRAVENOUS | Status: DC | PRN
  Administered 2024-05-16: 2 mg via INTRAVENOUS
  Filled 2024-05-16: qty 1

## 2024-05-16 MED ORDER — MORPHINE SULFATE (PF) 2 MG/ML IV SOLN
1.0000 mg | INTRAVENOUS | Status: DC | PRN
  Administered 2024-05-16: 1 mg via INTRAVENOUS
  Filled 2024-05-16: qty 1

## 2024-05-16 MED ORDER — ENOXAPARIN SODIUM 40 MG/0.4ML IJ SOSY
40.0000 mg | PREFILLED_SYRINGE | INTRAMUSCULAR | Status: DC
  Administered 2024-05-16 – 2024-05-17 (×2): 40 mg via SUBCUTANEOUS
  Filled 2024-05-16 (×2): qty 0.4

## 2024-05-16 MED ORDER — OXYCODONE-ACETAMINOPHEN 5-325 MG PO TABS
1.0000 | ORAL_TABLET | ORAL | Status: DC | PRN
  Administered 2024-05-16 – 2024-05-17 (×2): 2 via ORAL
  Filled 2024-05-16 (×2): qty 2

## 2024-05-16 MED ORDER — OXYCODONE-ACETAMINOPHEN 5-325 MG PO TABS
1.0000 | ORAL_TABLET | ORAL | Status: DC | PRN
  Administered 2024-05-16: 1 via ORAL
  Filled 2024-05-16: qty 1

## 2024-05-16 NOTE — TOC CM/SW Note (Signed)
 Transition of Care [facility]) - Inpatient Brief Assessment   Patient Details  Name: Daron Stutz MRN: [id number] Date of Birth: 04-26-1996  Transition of Care [facility]) CM/SW Contact:    Asberry CHRISTELLA Jaksch, RN Phone Number: 05/16/2024, 9:51 AM   Clinical Narrative:  Transition of Care [facility]) Screening Note   Patient Details  Name: Jayvien Rowlette Date of Birth: 02-11-1996   Transition of Care [facility]) CM/SW Contact:    Asberry CHRISTELLA Jaksch, RN Phone Number: 05/16/2024, 9:51 AM   Transition of Care Department [facility]) has reviewed patient and no TOC needs have been identified at this time. If new patient transition needs arise, please place a TOC consult.  Patient has no PCP identified. Attached list of local PCP's to AVS. Patient to follow-up with [facility] to find a provider in-network.     Transition of Care Asessment: Insurance and Status: Insurance coverage has been reviewed Patient has primary care physician: No (Attached list of local PCP's to AVS)     Prior/Current Home Services: No current home services Social Drivers of Health Review: SDOH reviewed no interventions necessary Readmission risk has been reviewed: Yes Transition of care needs: no transition of care needs at this time

## 2024-05-16 NOTE — Progress Notes (Signed)
 Pharmacy - PCN allergy clarification  Patient with allergy to PCN at age 28 causing rash and trouble breathing.  From chart review, noted patient prescribed cefuroxime in APril 2025 for pneumonia.  Discussed with patient and he states he took the antibiotic and tolerated without adverse effect   Whitney Hillegass, PharmD, BCPS, BCIDP Work Cell: [phone] 05/16/2024 11:42 AM  ]

## 2024-05-16 NOTE — Plan of Care (Signed)
  Problem: Education: Goal: Knowledge of General Education information will improve Description: Including pain rating scale, medication(s)/side effects and non-pharmacologic comfort measures Outcome: Progressing   Problem: Health Behavior/Discharge Planning: Goal: Ability to manage health-related needs will improve Outcome: Progressing   Problem: Clinical Measurements: Goal: Ability to maintain clinical measurements within normal limits will improve Outcome: Progressing Goal: Will remain free from infection Outcome: Progressing Goal: Diagnostic test results will improve Outcome: Progressing Goal: Respiratory complications will improve Outcome: Progressing Goal: Cardiovascular complication will be avoided Outcome: Progressing   Problem: Activity: Goal: Risk for activity intolerance will decrease Outcome: Progressing   Problem: Nutrition: Goal: Adequate nutrition will be maintained Outcome: Progressing   Problem: Coping: Goal: Level of anxiety will decrease Outcome: Progressing   Problem: Elimination: Goal: Will not experience complications related to bowel motility Outcome: Progressing Goal: Will not experience complications related to urinary retention Outcome: Progressing   Problem: Pain Managment: Goal: General experience of comfort will improve and/or be controlled Outcome: Progressing   Problem: Safety: Goal: Ability to remain free from injury will improve Outcome: Progressing   Problem: Skin Integrity: Goal: Skin integrity will improve Outcome: Progressing   Problem: Clinical Measurements: Goal: Ability to avoid or minimize complications of infection will improve Outcome: Progressing   Problem: Skin Integrity: Goal: Risk for impaired skin integrity will decrease Outcome: Progressing

## 2024-05-16 NOTE — Plan of Care (Signed)
  Problem: Education: Goal: Knowledge of General Education information will improve Description: Including pain rating scale, medication(s)/side effects and non-pharmacologic comfort measures Outcome: Progressing   Problem: Clinical Measurements: Goal: Respiratory complications will improve Outcome: Progressing   Problem: Clinical Measurements: Goal: Cardiovascular complication will be avoided Outcome: Progressing   Problem: Activity: Goal: Risk for activity intolerance will decrease Outcome: Progressing   Problem: Nutrition: Goal: Adequate nutrition will be maintained Outcome: Progressing   Problem: Pain Managment: Goal: General experience of comfort will improve and/or be controlled Outcome: Progressing

## 2024-05-16 NOTE — Progress Notes (Signed)
  PROGRESS NOTE    Luke Walter Walter  FMW:[id number] DOB: 03-12-1996 DOA: 05/15/2024 PCP: Patient, No Pcp Per  210A/210A-AA  LOS: 1 day   Brief hospital course:   Assessment & Plan: Luke Walter Walter is a 28 y.o. male with medical history significant of asthma and overweight, who presented with dental pain in the left side facial swelling.    Sepsis due to periapical abscess,  preseptal cellulitis and facial cellulitis: pt meets criteria for sepsis with WBC 15.8, temperature 102.5, heart rate 114.  --CT showed 1.7 cm abscess in the soft tissues along the left anterior aspect of the maxilla adjacent to a periapical abscess involving the left maxillary lateral incisor. --EDP consulted Dr. Rumalda of ENT, who recommended to transfer patient to where they have dental medicine or OMFS. Dr. Jossie of ED has made tremendous effort trying to transfer patient to the facility where there is dental medicine or OMFS.  Dr. Adine called [place], [facility] and [facility], unfortunately [facility] has no bed available.  Admitting physician discussed with the patient, he and his mother understand that we do not have dental medicine or oral surgeon to deal with the offending tooth, they agree to stay here for antibiotic treatment.   --patient received 1 dose of aztreonam , Flagyl  and vancomycin  in ED and continued on IV clinda. - started on IV Decadron  8 mg twice daily --per discussion with ID pharm, due to increasing resistance clinda against strep (and obv higher risk of c diff), abx changed to ceftriaxone /po flagyl .  --can discharge on ceftin/flagyl , to be taken until definitive tx with oral surgeon. --cont MIVF today  Asthma: stable   Overweight (BMI 25.0-29.9): Body weight 81.6 kg, BMI 25.83 - Encourage losing weight - Exercise and healthy diet    DVT prophylaxis: Lovenox  SQ Code Status: Full code  Family Communication: mother updated at bedside today Level of care: Telemetry Medical Dispo:   The patient is from:  home Anticipated d/c is to: home Anticipated d/c date is: tomorrow   Subjective and Interval History:  Pt reported swelling improved.  Asked to eat soft foods.   Objective: Vitals:   05/16/24 0439 05/16/24 0732 05/16/24 1534 05/16/24 1938  BP: 128/80 113/63 134/60 135/75  Pulse: 89 63 71 87  Resp: 18 17 18 20   Temp: 98.6 F (37 C) (!) 97.5 F (36.4 C) 98.2 F (36.8 C) 98 F (36.7 C)  TempSrc: Oral  Oral Oral  SpO2: 98% 97% 100% 100%  Weight:      Height:        Intake/Output Summary (Last 24 hours) at 05/16/2024 1940 Last data filed at 05/16/2024 1100 Gross per 24 hour  Intake 2325.33 ml  Output --  Net 2325.33 ml   Filed Weights   05/15/24 1145  Weight: 81.6 kg    Examination:   Constitutional: NAD, AAOx3 HEENT: conjunctivae and lids normal, EOMI CV: No cyanosis.   RESP: normal respiratory effort, on RA Neuro: II - XII grossly intact.   Psych: Normal mood and affect.  Appropriate judgement and reason   Data Reviewed: I have personally reviewed labs and imaging studies  Time spent: 35 minutes  Ellouise Haber, MD [facility] If 7PM-7AM, please contact night-coverage 05/16/2024, 7:40 PM

## 2024-05-17 DIAGNOSIS — K047 Periapical abscess without sinus: Secondary | ICD-10-CM | POA: Diagnosis not present

## 2024-05-17 LAB — CBC
HCT: 37.6 % — ABNORMAL LOW (ref 39.0–52.0)
Hemoglobin: 12.8 g/dL — ABNORMAL LOW (ref 13.0–17.0)
MCH: 29.7 pg (ref 26.0–34.0)
MCHC: 34 g/dL (ref 30.0–36.0)
MCV: 87.2 fL (ref 80.0–100.0)
Platelets: 265 K/uL (ref 150–400)
RBC: 4.31 MIL/uL (ref 4.22–5.81)
RDW: 12.7 % (ref 11.5–15.5)
WBC: 12.9 K/uL — ABNORMAL HIGH (ref 4.0–10.5)
nRBC: 0 % (ref 0.0–0.2)

## 2024-05-17 LAB — MRSA NEXT GEN BY PCR, NASAL: MRSA by PCR Next Gen: NOT DETECTED

## 2024-05-17 LAB — PROCALCITONIN: Procalcitonin: <0.1 ng/mL

## 2024-05-17 MED ORDER — MORPHINE SULFATE (PF) 2 MG/ML IV SOLN
2.0000 mg | INTRAVENOUS | Status: DC | PRN
  Administered 2024-05-17 – 2024-05-18 (×3): 2 mg via INTRAVENOUS
  Filled 2024-05-17 (×3): qty 1

## 2024-05-17 MED ORDER — DEXAMETHASONE SODIUM PHOSPHATE 10 MG/ML IJ SOLN
4.0000 mg | Freq: Four times a day (QID) | INTRAMUSCULAR | Status: DC
  Administered 2024-05-17 – 2024-05-18 (×3): 4 mg via INTRAVENOUS
  Filled 2024-05-17 (×3): qty 1

## 2024-05-17 MED ORDER — KETOROLAC TROMETHAMINE 15 MG/ML IJ SOLN
15.0000 mg | Freq: Four times a day (QID) | INTRAMUSCULAR | Status: DC
  Administered 2024-05-17 – 2024-05-18 (×4): 15 mg via INTRAVENOUS
  Filled 2024-05-17 (×4): qty 1

## 2024-05-17 MED ORDER — OXYCODONE HCL 5 MG PO TABS
5.0000 mg | ORAL_TABLET | ORAL | Status: DC | PRN
  Administered 2024-05-17 – 2024-05-18 (×2): 10 mg via ORAL
  Filled 2024-05-17 (×2): qty 2

## 2024-05-17 NOTE — Progress Notes (Signed)
 PROGRESS NOTE    Luke Walter  FMW:[id number] DOB: Nov 21, 1995 DOA: 05/15/2024 PCP: Patient, No Pcp Per    Brief Narrative:   28 y.o. male with medical history significant of asthma and overweight, who presents with dental pain in the left side facial swelling.   Per patient and his mother at the bedside, he has left upper dental pain due to broken tooth on that side for more than 4 days, which has significantly worsened since last night.  He developed left facial swelling, fever and chills.  His temperature is 102.5 in ED.  His dental pain is severe, constant, aching, nonradiating.  Patient has trismus, with difficulty opening mouth.  No chest pain, cough, SOB.  No nausea, vomiting, diarrhea or abdominal.  No symptoms UTI.  Mother called the dentist, but the earliest available appointment will be on Thursday.   Assessment & Plan:   Principal Problem:   Periapical abscess Active Problems:   Sepsis (HCC)   Facial cellulitis   Preseptal cellulitis   Asthma   Overweight (BMI 25.0-29.9)  Sepsis due to periapical abscess,  preseptal cellulitis and facial cellulitis: pt meets criteria for sepsis with WBC 15.8, temperature 102.5, heart rate 114.  --CT showed 1.7 cm abscess in the soft tissues along the left anterior aspect of the maxilla adjacent to a periapical abscess involving the left maxillary lateral incisor. --EDP consulted Dr. Rumalda of ENT, who recommended to transfer patient to where they have dental medicine or OMFS. Dr. Jossie of ED has made tremendous effort trying to transfer patient to the facility where there is dental medicine or OMFS.  Dr. Adine called [facility], unfortunately [facility] has no bed available.  Admitting physician discussed with the patient, he and his mother understand that we do not have dental medicine or oral surgeon to deal with the offending tooth, they agree to stay here for antibiotic treatment.   --patient received 1 dose of aztreonam ,  Flagyl  and vancomycin  in ED and continued on IV clinda. Plan: Continue steroids Add Toradol  Continue IV antibiotics Check MRSA screen Pain control Tentative plan discharge 7/24 if symptoms improved    Asthma: stable   Overweight (BMI 25.0-29.9): Body weight 81.6 kg, BMI 25.83 - Encourage losing weight - Exercise and healthy diet    DVT prophylaxis: SQ Lovenox  Code Status: Full Family Communication: Mother at bedside 7/23 Disposition Plan: Status is: Inpatient Remains inpatient appropriate because: Sepsis on IV antibiotics   Level of care: Telemetry Medical  Consultants:  None  Procedures:  None  Antimicrobials: Ceftriaxone    Subjective: Seen and examined.  Seen in bed.  Reports poor pain control.  Reports left-sided facial and neck swelling.  Objective: Vitals:   05/16/24 1534 05/16/24 1938 05/17/24 0449 05/17/24 0805  BP: 134/60 135/75 127/71 117/61  Pulse: 71 87 75 64  Resp: 18 20 16 16   Temp: 98.2 F (36.8 C) 98 F (36.7 C) 98.2 F (36.8 C) 98.6 F (37 C)  TempSrc: Oral Oral Oral Oral  SpO2: 100% 100% 99% 99%  Weight:      Height:        Intake/Output Summary (Last 24 hours) at 05/17/2024 1427 Last data filed at 05/17/2024 0900 Gross per 24 hour  Intake 979.34 ml  Output --  Net 979.34 ml   Filed Weights   05/15/24 1145  Weight: 81.6 kg    Examination:  General exam: Fatigue.  Uncomfortable HEENT: Left-sided periorbital swelling.  Left-sided neck swelling Respiratory system: Clear to auscultation.  Respiratory effort normal. Cardiovascular system: S1-S2, RRR, no murmurs, pedal edema Gastrointestinal system: Soft, T/ND, normal bowel sounds Central nervous system: Alert and oriented. No focal neurological deficits. Extremities: Symmetric 5 x 5 power. Skin: No rashes, lesions or ulcers Psychiatry: Judgement and insight appear normal. Mood & affect appropriate.     Data Reviewed: I have personally reviewed following labs and imaging  studies  CBC: Recent Labs  Lab 05/15/24 1555 05/16/24 0426 05/17/24 0225  WBC 15.8* 13.0* 12.9*  NEUTROABS 12.2*  --   --   HGB 14.3 13.1 12.8*  HCT 42.8 38.5* 37.6*  MCV 88.2 88.1 87.2  PLT 268 249 265   Basic Metabolic Panel: Recent Labs  Lab 05/15/24 1555 05/16/24 0426  NA 138 140  K 3.7 4.3  CL 100 103  CO2 28 28  GLUCOSE 108* 151*  BUN 12 11  CREATININE 0.72 0.61  CALCIUM 9.3 9.2   GFR: Estimated Creatinine Clearance: 143.2 mL/min (by C-G formula based on SCr of 0.61 mg/dL). Liver Function Tests: Recent Labs  Lab 05/15/24 1555  AST 20  ALT 13  ALKPHOS 71  BILITOT 1.0  PROT 7.6  ALBUMIN 4.3   No results for input(s): LIPASE, AMYLASE in the last 168 hours. No results for input(s): AMMONIA in the last 168 hours. Coagulation Profile: Recent Labs  Lab 05/16/24 0426  INR 1.1   Cardiac Enzymes: No results for input(s): CKTOTAL, CKMB, CKMBINDEX, TROPONINI in the last 168 hours. BNP (last 3 results) No results for input(s): PROBNP in the last 8760 hours. HbA1C: No results for input(s): HGBA1C in the last 72 hours. CBG: No results for input(s): GLUCAP in the last 168 hours. Lipid Profile: No results for input(s): CHOL, HDL, LDLCALC, TRIG, CHOLHDL, LDLDIRECT in the last 72 hours. Thyroid Function Tests: No results for input(s): TSH, T4TOTAL, FREET4, T3FREE, THYROIDAB in the last 72 hours. Anemia Panel: No results for input(s): VITAMINB12, FOLATE, FERRITIN, TIBC, IRON, RETICCTPCT in the last 72 hours. Sepsis Labs: Recent Labs  Lab 05/15/24 1639 05/15/24 1905 05/17/24 0225  PROCALCITON  --   --  <0.10  LATICACIDVEN 1.0 0.7  --     Recent Results (from the past 240 hours)  Culture, blood (Routine X 2) w Reflex to ID Panel     Status: None (Preliminary result)   Collection Time: 05/15/24  4:39 PM   Specimen: BLOOD  Result Value Ref Range Status   Specimen Description BLOOD LEFT ANTECUBITAL   Final   Special Requests   Final    BOTTLES DRAWN AEROBIC AND ANAEROBIC Blood Culture adequate volume   Culture   Final    NO GROWTH 2 DAYS Performed at [facility], [address], [place], [place] [address]    Report Status PENDING  Incomplete  Culture, blood (Routine X 2) w Reflex to ID Panel     Status: None (Preliminary result)   Collection Time: 05/15/24  4:39 PM   Specimen: BLOOD  Result Value Ref Range Status   Specimen Description BLOOD RIGHT ANTECUBITAL  Final   Special Requests   Final    BOTTLES DRAWN AEROBIC AND ANAEROBIC Blood Culture adequate volume   Culture   Final    NO GROWTH 2 DAYS Performed at [facility], [address] [place], [place] [address]    Report Status PENDING  Incomplete  MRSA Next Gen by PCR, Nasal     Status: None   Collection Time: 05/17/24 11:06 AM   Specimen: Nasal Mucosa; Nasal  Swab  Result Value Ref Range Status   MRSA by PCR Next Gen NOT DETECTED NOT DETECTED Final    Comment: (NOTE) The GeneXpert MRSA Assay (FDA approved for NASAL specimens only), is one component of a comprehensive MRSA colonization surveillance program. It is not intended to diagnose MRSA infection nor to guide or monitor treatment for MRSA infections. Test performance is not FDA approved in patients less than 45 years old. Performed at [facility], [address] [place], [place] [address]          Radiology Studies: CT Soft Tissue Neck W Contrast Result Date: 05/15/2024 CLINICAL DATA:  Soft tissue infection suspected. Dental pain and swelling on left side of face starting 3 days ago. Some difficulty swallowing. EXAM: CT NECK WITH CONTRAST TECHNIQUE: Multidetector CT imaging of the neck was performed using the standard protocol following the bolus administration of intravenous contrast. RADIATION DOSE REDUCTION: This exam was performed according to the departmental dose-optimization program which includes automated exposure  control, adjustment of the mA and/or kV according to patient size and/or use of iterative reconstruction technique. CONTRAST:  75mL OMNIPAQUE  IOHEXOL  300 MG/ML  SOLN COMPARISON:  None Available. FINDINGS: There is a periapical lucency involving the left maxillary lateral incisor with thinning of the overlying cortex. Within the adjacent soft tissues there is a 1.7 x 0.6 x 1.0 cm peripherally enhancing fluid collection with few associated locules of gas. The collection extends inferiorly within the soft tissues along the left aspect of the maxilla. There is significant surrounding soft tissue swelling. There is skin thickening over the midline aspect of the face with involvement of the upper lip. Stranding extends into the subcutaneous tissues of the left face overlying the maxilla and mandible with extension superiorly into the preseptal soft tissues of the left orbit. There is also soft tissue swelling extending along the left nasal labial fold. Soft tissue stranding also noted within the right facial soft tissues overlying the maxilla. There is soft tissue swelling extending into the anterior neck with mild thickening of the platysma more pronounced on the left. Pharynx and larynx: The nasopharynx is symmetric. Mild prominence of the palatine tonsils without abnormal enhancement or evidence of peritonsillar fluid collection. The oral cavity and floor of mouth is unremarkable. The base of tongue and epiglottis are unremarkable. No retropharyngeal effusion. The aryepiglottic folds and piriform sinuses are symmetric. Symmetric appearance of the vocal folds. Salivary glands: No inflammation, mass, or stone. Thyroid: Normal. Lymph nodes: Enlarged left-sided cervical lymph nodes particularly in level 1 B and level 2A measuring up to 1.2 cm in short axis. There are additional prominent right-sided cervical nodes particularly in level 2 measuring up to 1.2 cm in short axis. Vascular: Negative. Limited intracranial:  Negative. Visualized orbits: The globes are intact. Lenses are normally located. There is no evidence of abnormal soft tissue swelling within the orbit. There is swelling of the preseptal soft tissues along the inferior aspect of the left orbit. Mastoids and visualized paranasal sinuses: Mucosal thickening throughout the left maxillary sinus. No air-fluid levels in the visualized sinuses. Mastoid air cells are clear. Skeleton: No acute or aggressive process. Upper chest: Negative. Other: None. IMPRESSION: 1.7 cm abscess in the soft tissues along the left anterior aspect of the maxilla adjacent to a periapical abscess involving the left maxillary lateral incisor. Surrounding soft tissue swelling within the facial soft tissues as above with extension into the preseptal soft tissues of the left orbit. Findings concerning for facial/preseptal cellulitis. There are  no findings to suggest orbital cellulitis. Enlarged cervical lymph nodes, likely reactive. Electronically Signed   By: Donnice Mania M.D.   On: 05/15/2024 19:46        Scheduled Meds:  dexamethasone  (DECADRON ) injection  4 mg Intravenous Q6H   enoxaparin  (LOVENOX ) injection  40 mg Subcutaneous Q24H   ketorolac   15 mg Intravenous Q6H   metroNIDAZOLE   500 mg Oral BID   Continuous Infusions:  cefTRIAXone  (ROCEPHIN )  IV 2 g (05/17/24 1337)     LOS: 2 days    Calvin KATHEE Robson, MD [facility]   If 7PM-7AM, please contact night-coverage  05/17/2024, 2:27 PM

## 2024-05-18 DIAGNOSIS — K047 Periapical abscess without sinus: Secondary | ICD-10-CM | POA: Diagnosis not present

## 2024-05-18 LAB — CBC
HCT: 38.9 % — ABNORMAL LOW (ref 39.0–52.0)
Hemoglobin: 13.2 g/dL (ref 13.0–17.0)
MCH: 29.4 pg (ref 26.0–34.0)
MCHC: 33.9 g/dL (ref 30.0–36.0)
MCV: 86.6 fL (ref 80.0–100.0)
Platelets: 275 K/uL (ref 150–400)
RBC: 4.49 MIL/uL (ref 4.22–5.81)
RDW: 12.6 % (ref 11.5–15.5)
WBC: 10.6 K/uL — ABNORMAL HIGH (ref 4.0–10.5)
nRBC: 0 % (ref 0.0–0.2)

## 2024-05-18 MED ORDER — METRONIDAZOLE 500 MG PO TABS: 500.0000 mg | ORAL_TABLET | Freq: Three times a day (TID) | ORAL | 0 refills | Status: AC

## 2024-05-18 MED ORDER — ONDANSETRON HCL 4 MG PO TABS: 4.0000 mg | ORAL_TABLET | Freq: Every day | ORAL | 1 refills | Status: AC | PRN

## 2024-05-18 MED ORDER — CEFDINIR 300 MG PO CAPS: 300.0000 mg | ORAL_CAPSULE | Freq: Two times a day (BID) | ORAL | 0 refills | Status: AC

## 2024-05-18 MED ORDER — METHYLPREDNISOLONE 4 MG PO TBPK: ORAL_TABLET | ORAL | 0 refills | Status: AC

## 2024-05-18 MED ORDER — OXYCODONE HCL 5 MG PO TABS: 5.0000 mg | ORAL_TABLET | Freq: Four times a day (QID) | ORAL | 0 refills | Status: AC | PRN

## 2024-05-18 NOTE — Plan of Care (Signed)
  Problem: Education: Goal: Knowledge of General Education information will improve Description: Including pain rating scale, medication(s)/side effects and non-pharmacologic comfort measures Outcome: Progressing   Problem: Health Behavior/Discharge Planning: Goal: Ability to manage health-related needs will improve Outcome: Progressing   Problem: Clinical Measurements: Goal: Ability to maintain clinical measurements within normal limits will improve Outcome: Progressing Goal: Will remain free from infection Outcome: Progressing Goal: Diagnostic test results will improve Outcome: Progressing   Problem: Coping: Goal: Level of anxiety will decrease Outcome: Progressing   Problem: Elimination: Goal: Will not experience complications related to bowel motility Outcome: Progressing Goal: Will not experience complications related to urinary retention Outcome: Progressing

## 2024-05-18 NOTE — Discharge Summary (Signed)
 Physician Discharge Summary  Luke Walter FMW:[id number] DOB: 11-06-1995 DOA: 05/15/2024  PCP: Patient, No Pcp Per  Admit date: 05/15/2024 Discharge date: 05/18/2024  Admitted From: Home Disposition:  Home  Recommendations for Outpatient Follow-up:  Follow up with PCP in 1-2 weeks Follow-up with dentistry/oral surgery ASAP  Home Health:No  Equipment/Devices:None   Discharge Condition:Stable  CODE STATUS:FULL  Diet recommendation: Soft  Brief/Interim Summary:  28 y.o. male with medical history significant of asthma and overweight, who presents with dental pain in the left side facial swelling.   Per patient and his mother at the bedside, he has left upper dental pain due to broken tooth on that side for more than 4 days, which has significantly worsened since last night.  He developed left facial swelling, fever and chills.  His temperature is 102.5 in ED.  His dental pain is severe, constant, aching, nonradiating.  Patient has trismus, with difficulty opening mouth.  No chest pain, cough, SOB.  No nausea, vomiting, diarrhea or abdominal.  No symptoms UTI.  Mother called the dentist, but the earliest available appointment will be on Thursday.   Discharge Diagnoses:  Principal Problem:   Periapical abscess Active Problems:   Sepsis (HCC)   Facial cellulitis   Preseptal cellulitis   Asthma   Overweight (BMI 25.0-29.9)   Sepsis due to periapical abscess,  preseptal cellulitis and facial cellulitis: pt meets criteria for sepsis with WBC 15.8, temperature 102.5, heart rate 114.  --CT showed 1.7 cm abscess in the soft tissues along the left anterior aspect of the maxilla adjacent to a periapical abscess involving the left maxillary lateral incisor. --EDP consulted Dr. Rumalda of ENT, who recommended to transfer patient to where they have dental medicine or OMFS. Dr. Jossie of ED has made tremendous effort trying to transfer patient to the facility where there is dental medicine or OMFS.   Dr. Adine called [facility], [facility] and [facility], unfortunately [facility] has no bed available.  Admitting physician discussed with the patient, he and his mother understand that we do not have dental medicine or oral surgeon to deal with the offending tooth, they agree to stay here for antibiotic treatment.   --patient received 1 dose of aztreonam , Flagyl  and vancomycin  in ED and continued on IV clinda. Plan:  Patient is nonseptic, nontoxic, afebrile.  Swelling in left periorbital region and left neck has improved but not yet resolved.  Patient tolerating p.o. intake.  No acute concern for airway compromise.  Patient has a appointment scheduled for dentist on 7/24, day of discharge.  He will discharge from the hospital and present to the dentist for consideration of tooth extraction.  I will prescribe a short course of antibiotics and empiric steroids.  Pain and nausea control prescribed as well.  Patient cautioned extensively along with mother.  Should he experience any worsening fevers, facial swelling, signs of airway compromise, worsening pain, or facial discharge that he should present to an emergency department immediately.  Ideally should present to a facility that has oral surgery capabilities.   Discharge Instructions  Discharge Instructions     Diet - low sodium heart healthy   Complete by: As directed    Increase activity slowly   Complete by: As directed       Allergies as of 05/18/2024       Reactions   Penicillins Hives, Swelling, Other (See Comments)   Throat swelling and hives Given cefuroxime 01/2024 at [facility] and states he tolerated   Amoxicillin Hives, Swelling  Medication List     TAKE these medications    acetaminophen  650 MG CR tablet Commonly known as: TYLENOL  Take 1,300 mg by mouth every 8 (eight) hours as needed for pain.   cefdinir  300 MG capsule Commonly known as: OMNICEF  Take 1 capsule (300 mg total) by mouth 2 (two) times daily for 7 days.    ibuprofen  200 MG tablet Commonly known as: ADVIL  Take 800 mg by mouth every 6 (six) hours as needed.   methylPREDNISolone  4 MG Tbpk tablet Commonly known as: MEDROL  DOSEPAK Steroid taper.  Take as per package directions   metroNIDAZOLE  500 MG tablet Commonly known as: FLAGYL  Take 1 tablet (500 mg total) by mouth 3 (three) times daily for 7 days.   ondansetron  4 MG tablet Commonly known as: Zofran  Take 1 tablet (4 mg total) by mouth daily as needed for nausea or vomiting.   oxyCODONE  5 MG immediate release tablet Commonly known as: Oxy IR/ROXICODONE  Take 1-2 tablets (5-10 mg total) by mouth every 6 (six) hours as needed for breakthrough pain.        Allergies  Allergen Reactions   Penicillins Hives, Swelling and Other (See Comments)    Throat swelling and hives Given cefuroxime 01/2024 at [facility] and states he tolerated   Amoxicillin Hives and Swelling    Consultations: None   Procedures/Studies: CT Soft Tissue Neck W Contrast Result Date: 05/15/2024 CLINICAL DATA:  Soft tissue infection suspected. Dental pain and swelling on left side of face starting 3 days ago. Some difficulty swallowing. EXAM: CT NECK WITH CONTRAST TECHNIQUE: Multidetector CT imaging of the neck was performed using the standard protocol following the bolus administration of intravenous contrast. RADIATION DOSE REDUCTION: This exam was performed according to the departmental dose-optimization program which includes automated exposure control, adjustment of the mA and/or kV according to patient size and/or use of iterative reconstruction technique. CONTRAST:  75mL OMNIPAQUE  IOHEXOL  300 MG/ML  SOLN COMPARISON:  None Available. FINDINGS: There is a periapical lucency involving the left maxillary lateral incisor with thinning of the overlying cortex. Within the adjacent soft tissues there is a 1.7 x 0.6 x 1.0 cm peripherally enhancing fluid collection with few associated locules of gas. The collection extends  inferiorly within the soft tissues along the left aspect of the maxilla. There is significant surrounding soft tissue swelling. There is skin thickening over the midline aspect of the face with involvement of the upper lip. Stranding extends into the subcutaneous tissues of the left face overlying the maxilla and mandible with extension superiorly into the preseptal soft tissues of the left orbit. There is also soft tissue swelling extending along the left nasal labial fold. Soft tissue stranding also noted within the right facial soft tissues overlying the maxilla. There is soft tissue swelling extending into the anterior neck with mild thickening of the platysma more pronounced on the left. Pharynx and larynx: The nasopharynx is symmetric. Mild prominence of the palatine tonsils without abnormal enhancement or evidence of peritonsillar fluid collection. The oral cavity and floor of mouth is unremarkable. The base of tongue and epiglottis are unremarkable. No retropharyngeal effusion. The aryepiglottic folds and piriform sinuses are symmetric. Symmetric appearance of the vocal folds. Salivary glands: No inflammation, mass, or stone. Thyroid: Normal. Lymph nodes: Enlarged left-sided cervical lymph nodes particularly in level 1 B and level 2A measuring up to 1.2 cm in short axis. There are additional prominent right-sided cervical nodes particularly in level 2 measuring up to 1.2 cm in short axis. Vascular:  Negative. Limited intracranial: Negative. Visualized orbits: The globes are intact. Lenses are normally located. There is no evidence of abnormal soft tissue swelling within the orbit. There is swelling of the preseptal soft tissues along the inferior aspect of the left orbit. Mastoids and visualized paranasal sinuses: Mucosal thickening throughout the left maxillary sinus. No air-fluid levels in the visualized sinuses. Mastoid air cells are clear. Skeleton: No acute or aggressive process. Upper chest: Negative.  Other: None. IMPRESSION: 1.7 cm abscess in the soft tissues along the left anterior aspect of the maxilla adjacent to a periapical abscess involving the left maxillary lateral incisor. Surrounding soft tissue swelling within the facial soft tissues as above with extension into the preseptal soft tissues of the left orbit. Findings concerning for facial/preseptal cellulitis. There are no findings to suggest orbital cellulitis. Enlarged cervical lymph nodes, likely reactive. Electronically Signed   By: Donnice Mania M.D.   On: 05/15/2024 19:46      Subjective: Seen and examined on the day of discharge.  Stable no distress.  Appropriate for discharge home.  Discharge Exam: Vitals:   05/17/24 2106 05/18/24 0429  BP: 138/70 (!) 140/73  Pulse: 62 (!) 51  Resp: 20 16  Temp: 98.8 F (37.1 C) 98 F (36.7 C)  SpO2: 100% 99%   Vitals:   05/17/24 0805 05/17/24 1539 05/17/24 2106 05/18/24 0429  BP: 117/61 (!) 113/58 138/70 (!) 140/73  Pulse: 64 82 62 (!) 51  Resp: 16 18 20 16   Temp: 98.6 F (37 C) 99.2 F (37.3 C) 98.8 F (37.1 C) 98 F (36.7 C)  TempSrc: Oral Oral Oral Oral  SpO2: 99% 98% 100% 99%  Weight:      Height:        General: Pt is alert, awake, not in acute distress Cardiovascular: RRR, S1/S2 +, no rubs, no gallops Respiratory: CTA bilaterally, no wheezing, no rhonchi Abdominal: Soft, NT, ND, bowel sounds + Extremities: no edema, no cyanosis    The results of significant diagnostics from this hospitalization (including imaging, microbiology, ancillary and laboratory) are listed below for reference.     Microbiology: Recent Results (from the past 240 hours)  Culture, blood (Routine X 2) w Reflex to ID Panel     Status: None (Preliminary result)   Collection Time: 05/15/24  4:39 PM   Specimen: BLOOD  Result Value Ref Range Status   Specimen Description BLOOD LEFT ANTECUBITAL  Final   Special Requests   Final    BOTTLES DRAWN AEROBIC AND ANAEROBIC Blood Culture  adequate volume   Culture   Final    NO GROWTH 3 DAYS Performed at [facility], [address] [place], [place] [address]    Report Status PENDING  Incomplete  Culture, blood (Routine X 2) w Reflex to ID Panel     Status: None (Preliminary result)   Collection Time: 05/15/24  4:39 PM   Specimen: BLOOD  Result Value Ref Range Status   Specimen Description BLOOD RIGHT ANTECUBITAL  Final   Special Requests   Final    BOTTLES DRAWN AEROBIC AND ANAEROBIC Blood Culture adequate volume   Culture   Final    NO GROWTH 3 DAYS Performed at [facility], [address] [place], [place] [address]    Report Status PENDING  Incomplete  MRSA Next Gen by PCR, Nasal     Status: None   Collection Time: 05/17/24 11:06 AM   Specimen: Nasal Mucosa; Nasal Swab  Result Value Ref Range Status  MRSA by PCR Next Gen NOT DETECTED NOT DETECTED Final    Comment: (NOTE) The GeneXpert MRSA Assay (FDA approved for NASAL specimens only), is one component of a comprehensive MRSA colonization surveillance program. It is not intended to diagnose MRSA infection nor to guide or monitor treatment for MRSA infections. Test performance is not FDA approved in patients less than 60 years old. Performed at [facility], [address] [place], [place] [address]      Labs: BNP (last 3 results) No results for input(s): BNP in the last 8760 hours. Basic Metabolic Panel: Recent Labs  Lab 05/15/24 1555 05/16/24 0426  NA 138 140  K 3.7 4.3  CL 100 103  CO2 28 28  GLUCOSE 108* 151*  BUN 12 11  CREATININE 0.72 0.61  CALCIUM 9.3 9.2   Liver Function Tests: Recent Labs  Lab 05/15/24 1555  AST 20  ALT 13  ALKPHOS 71  BILITOT 1.0  PROT 7.6  ALBUMIN 4.3   No results for input(s): LIPASE, AMYLASE in the last 168 hours. No results for input(s): AMMONIA in the last 168 hours. CBC: Recent Labs  Lab 05/15/24 1555 05/16/24 0426 05/17/24 0225 05/18/24 0353  WBC 15.8*  13.0* 12.9* 10.6*  NEUTROABS 12.2*  --   --   --   HGB 14.3 13.1 12.8* 13.2  HCT 42.8 38.5* 37.6* 38.9*  MCV 88.2 88.1 87.2 86.6  PLT 268 249 265 275   Cardiac Enzymes: No results for input(s): CKTOTAL, CKMB, CKMBINDEX, TROPONINI in the last 168 hours. BNP: Invalid input(s): POCBNP CBG: No results for input(s): GLUCAP in the last 168 hours. D-Dimer No results for input(s): DDIMER in the last 72 hours. Hgb A1c No results for input(s): HGBA1C in the last 72 hours. Lipid Profile No results for input(s): CHOL, HDL, LDLCALC, TRIG, CHOLHDL, LDLDIRECT in the last 72 hours. Thyroid function studies No results for input(s): TSH, T4TOTAL, T3FREE, THYROIDAB in the last 72 hours.  Invalid input(s): FREET3 Anemia work up No results for input(s): VITAMINB12, FOLATE, FERRITIN, TIBC, IRON, RETICCTPCT in the last 72 hours. Urinalysis No results found for: COLORURINE, APPEARANCEUR, LABSPEC, PHURINE, GLUCOSEU, HGBUR, BILIRUBINUR, KETONESUR, PROTEINUR, UROBILINOGEN, NITRITE, LEUKOCYTESUR Sepsis Labs Recent Labs  Lab 05/15/24 1555 05/16/24 0426 05/17/24 0225 05/18/24 0353  WBC 15.8* 13.0* 12.9* 10.6*   Microbiology Recent Results (from the past 240 hours)  Culture, blood (Routine X 2) w Reflex to ID Panel     Status: None (Preliminary result)   Collection Time: 05/15/24  4:39 PM   Specimen: BLOOD  Result Value Ref Range Status   Specimen Description BLOOD LEFT ANTECUBITAL  Final   Special Requests   Final    BOTTLES DRAWN AEROBIC AND ANAEROBIC Blood Culture adequate volume   Culture   Final    NO GROWTH 3 DAYS Performed at [facility], [address] [place], [place] [address]    Report Status PENDING  Incomplete  Culture, blood (Routine X 2) w Reflex to ID Panel     Status: None (Preliminary result)   Collection Time: 05/15/24  4:39 PM   Specimen: BLOOD  Result Value Ref Range Status   Specimen  Description BLOOD RIGHT ANTECUBITAL  Final   Special Requests   Final    BOTTLES DRAWN AEROBIC AND ANAEROBIC Blood Culture adequate volume   Culture   Final    NO GROWTH 3 DAYS Performed at [facility], [address] [place], [place] [address]    Report Status PENDING  Incomplete  MRSA Next Gen by PCR, Nasal     Status: None   Collection Time: 05/17/24 11:06 AM   Specimen: Nasal Mucosa; Nasal Swab  Result Value Ref Range Status   MRSA by PCR Next Gen NOT DETECTED NOT DETECTED Final    Comment: (NOTE) The GeneXpert MRSA Assay (FDA approved for NASAL specimens only), is one component of a comprehensive MRSA colonization surveillance program. It is not intended to diagnose MRSA infection nor to guide or monitor treatment for MRSA infections. Test performance is not FDA approved in patients less than 4 years old. Performed at [facility], [address] [place], [place] [address]      Time coordinating discharge: 45 minutes  SIGNED:   Calvin KATHEE Robson, MD  [facility] 05/18/2024, 12:43 PM Pager   If 7PM-7AM, please contact night-coverage

## 2024-05-20 LAB — CULTURE, BLOOD (ROUTINE X 2)
Culture: NO GROWTH
Culture: NO GROWTH
Special Requests: ADEQUATE
Special Requests: ADEQUATE
